# Patient Record
Sex: Male | Born: 1970 | Race: Black or African American | Hispanic: No | Marital: Single | Smoking: Current every day smoker
Health system: Southern US, Community
[De-identification: ages and names within clinical notes are randomized; demographics above are authoritative.]

---

## 2003-11-24 ENCOUNTER — Ambulatory Visit: Payer: Self-pay | Admitting: Nurse Practitioner

## 2003-11-24 ENCOUNTER — Ambulatory Visit: Payer: Self-pay | Admitting: Family Medicine

## 2003-11-29 ENCOUNTER — Ambulatory Visit: Payer: Self-pay | Admitting: *Deleted

## 2003-12-15 ENCOUNTER — Ambulatory Visit: Payer: Self-pay | Admitting: Family Medicine

## 2004-02-01 ENCOUNTER — Ambulatory Visit: Payer: Self-pay | Admitting: Nurse Practitioner

## 2004-05-16 ENCOUNTER — Ambulatory Visit: Payer: Self-pay | Admitting: Nurse Practitioner

## 2004-06-30 ENCOUNTER — Ambulatory Visit (HOSPITAL_BASED_OUTPATIENT_CLINIC_OR_DEPARTMENT_OTHER): Admission: RE | Admit: 2004-06-30 | Discharge: 2004-06-30 | Payer: Self-pay | Admitting: General Surgery

## 2004-06-30 ENCOUNTER — Ambulatory Visit (HOSPITAL_COMMUNITY): Admission: RE | Admit: 2004-06-30 | Discharge: 2004-06-30 | Payer: Self-pay | Admitting: General Surgery

## 2004-09-28 ENCOUNTER — Ambulatory Visit: Payer: Self-pay | Admitting: Nurse Practitioner

## 2004-10-09 ENCOUNTER — Ambulatory Visit (HOSPITAL_COMMUNITY): Admission: RE | Admit: 2004-10-09 | Discharge: 2004-10-09 | Payer: Self-pay | Admitting: Nurse Practitioner

## 2004-10-09 ENCOUNTER — Ambulatory Visit: Payer: Self-pay | Admitting: Nurse Practitioner

## 2014-04-30 ENCOUNTER — Emergency Department (HOSPITAL_COMMUNITY)
Admission: EM | Admit: 2014-04-30 | Discharge: 2014-04-30 | Disposition: A | Discharge status: Home / Self Care | Payer: Self-pay | Attending: Emergency Medicine | Admitting: Emergency Medicine

## 2014-04-30 ENCOUNTER — Emergency Department (HOSPITAL_COMMUNITY): Payer: Self-pay

## 2014-04-30 ENCOUNTER — Encounter (HOSPITAL_COMMUNITY): Payer: Self-pay | Admitting: *Deleted

## 2014-04-30 DIAGNOSIS — J159 Unspecified bacterial pneumonia: Secondary | ICD-10-CM | POA: Insufficient documentation

## 2014-04-30 DIAGNOSIS — Z720 Tobacco use: Secondary | ICD-10-CM | POA: Insufficient documentation

## 2014-04-30 DIAGNOSIS — R197 Diarrhea, unspecified: Secondary | ICD-10-CM | POA: Insufficient documentation

## 2014-04-30 DIAGNOSIS — J189 Pneumonia, unspecified organism: Secondary | ICD-10-CM

## 2014-04-30 MED ORDER — AZITHROMYCIN 250 MG PO TABS
500.0000 mg | ORAL_TABLET | Freq: Once | ORAL | Status: AC
  Administered 2014-04-30: 500 mg via ORAL
  Filled 2014-04-30: qty 2

## 2014-04-30 MED ORDER — AZITHROMYCIN 250 MG PO TABS: ORAL_TABLET | ORAL | Status: AC

## 2014-04-30 NOTE — ED Notes (Signed)
Pt reports not feeling well for several days, having productive cough with brown sputum, headaches, chills, bodyaches.

## 2014-04-30 NOTE — ED Provider Notes (Signed)
CSN: [id number]     Arrival date & time 04/30/14  1845 History   First MD Initiated Contact with Patient 04/30/14 1859     Chief Complaint  Patient presents with  . Cough  . Fever  . Headache    Patient is a 44 y.o. male presenting with cough, fever, and headaches. The history is provided by the patient.  Cough Severity:  Moderate Onset quality:  Gradual Duration:  4 days Timing:  Intermittent Progression:  Worsening Chronicity:  New Smoker: yes   Relieved by:  Nothing Worsened by:  Nothing tried Associated symptoms: fever, headaches and shortness of breath   Associated symptoms: no sore throat   Fever Associated symptoms: cough, diarrhea and headaches   Associated symptoms: no sore throat   Headache Associated symptoms: cough, diarrhea and fever   Associated symptoms: no sore throat   no travel is reported He reports the sputum changed to brownish color  PMH - none Soc hx - smoker  History  Substance Use Topics  . Smoking status: Current Every Day Smoker    Types: Cigarettes  . Smokeless tobacco: Not on file  . Alcohol Use: No    Review of Systems  Constitutional: Positive for fever.  HENT: Negative for sore throat.   Respiratory: Positive for cough and shortness of breath.   Gastrointestinal: Positive for diarrhea.  Neurological: Positive for headaches.  All other systems reviewed and are negative.     Allergies  Review of patient's allergies indicates no known allergies.  Home Medications   Prior to Admission medications   Not on File   BP 109/67 mmHg  Pulse 94  Temp(Src) 99.6 F (37.6 C) (Oral)  Resp 20  Ht 5\' 8"  (1.727 m)  Wt 194 lb 9.6 oz (88.27 kg)  BMI 29.60 kg/m2  SpO2 96% Physical Exam CONSTITUTIONAL: Well developed/well nourished HEAD: Normocephalic/atraumatic EYES: EOMI/PERRL ENMT: Mucous membranes moist, uvula midline, no erythema or exudates noted NECK: supple no meningeal signs CV: S1/S2 noted, no murmurs/rubs/gallops  noted LUNGS: coarse BS noted in left base, no apparent distress ABDOMEN: soft, nontender, no rebound or guarding, bowel sounds noted throughout abdomen NEURO: Pt is awake/alert/appropriate, moves all extremitiesx4.  No facial droop.   EXTREMITIES: pulses normal/equal, full ROM SKIN: warm, color normal PSYCH: no abnormalities of mood noted, alert and oriented to situation  ED Course  Procedures  Imaging Review Dg Chest 2 View  04/30/2014   CLINICAL DATA:  Cough. Fatigue. Weakness of 4 days duration. Smoking history.  EXAM: CHEST  2 VIEW  COMPARISON:  None.  FINDINGS: Heart size is normal. Mediastinal shadows are normal. There is patchy pneumonia in the left lower lobe. The remainder of the lungs are clear. No effusions. No bony abnormalities.  IMPRESSION: Patchy bronchopneumonia in the left lower lobe.   Electronically Signed   By: Paulina FusiMark  Shogry M.D.   On: 04/30/2014 19:36    8:17 PM Pt with pneumonia He is in no distress No hypoxia No tachycardia No tachypnea on my exam We discussed strict return precautions He is appropriate for d/c home  MDM   Final diagnoses:  CAP (community acquired pneumonia)    Nursing notes including past medical history and social history reviewed and considered in documentation xrays/imaging reviewed by myself and considered during evaluation     Zadie Rhineonald Lulabelle Desta, MD 04/30/14 2018

## 2015-11-29 IMAGING — CR DG CHEST 2V
2 series · 2 of 2 positions shown · non-contrast
Comparison: None.

CLINICAL DATA: Cough. Fatigue. Weakness of 4 days duration. Smoking
history.

EXAM:
CHEST  2 VIEW

[chest pa]
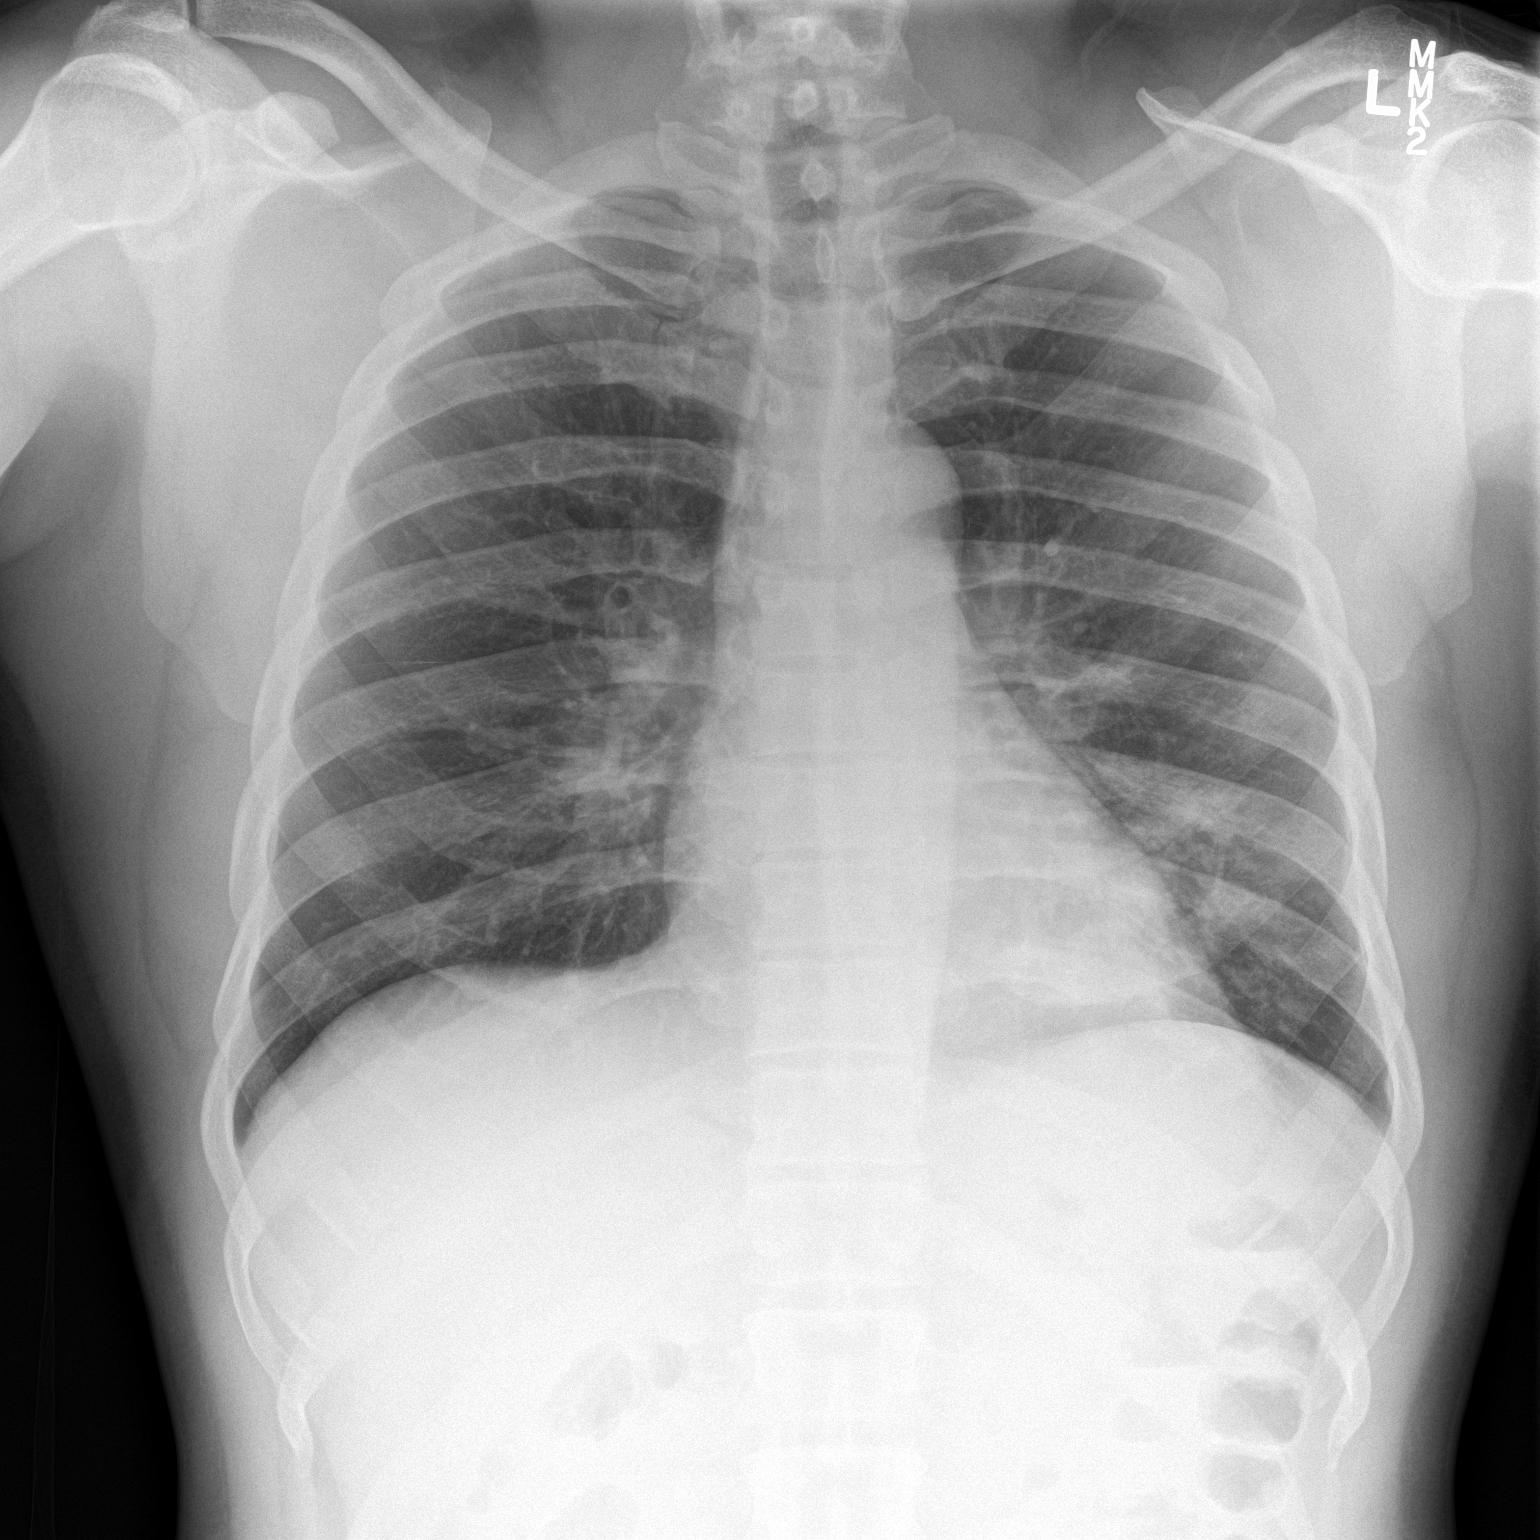

[chest lat]
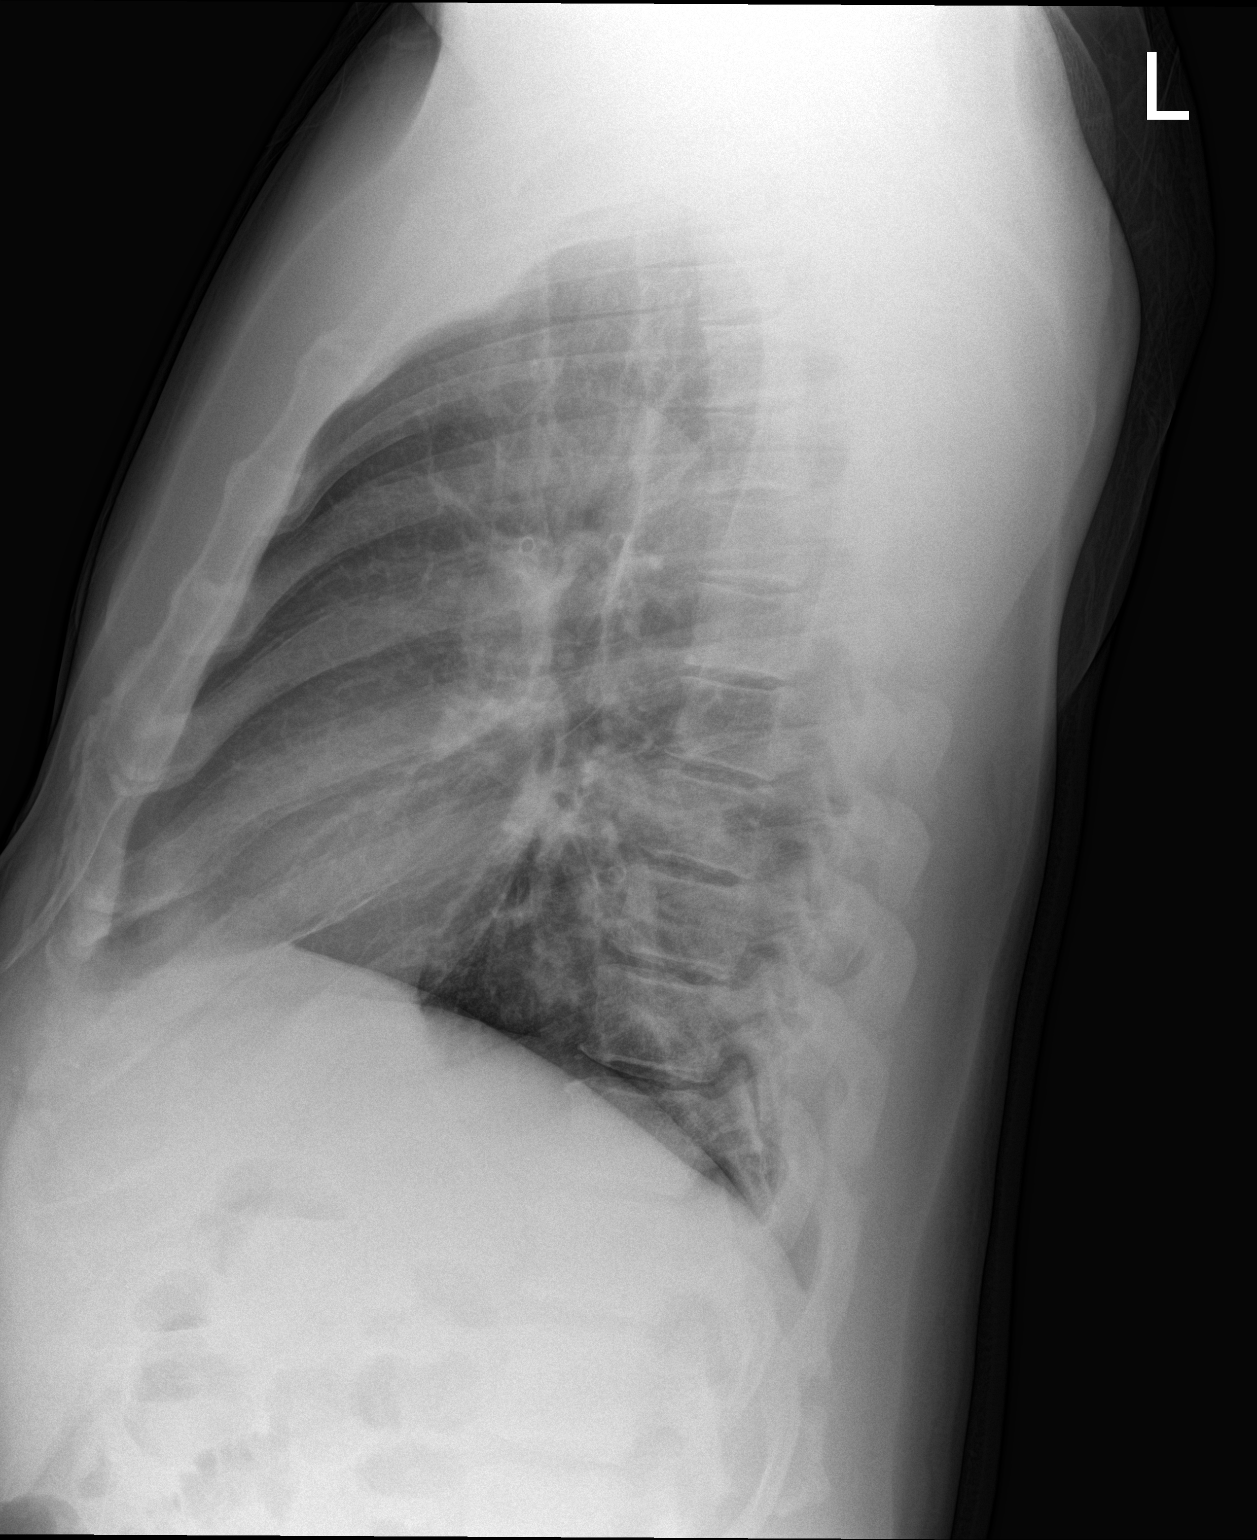

[2 of 2 positions shown; findings below may reference images not displayed]

FINDINGS: Heart size is normal. Mediastinal shadows are normal. There is
patchy pneumonia in the left lower lobe. The remainder of the lungs
are clear. No effusions. No bony abnormalities.
IMPRESSION: Patchy bronchopneumonia in the left lower lobe.
